# Patient Record
Sex: Male | Born: 1970 | Race: Black or African American | Hispanic: No | Marital: Single | Smoking: Current every day smoker
Health system: Southern US, Community
[De-identification: ages and names within clinical notes are randomized; demographics above are authoritative.]

---

## 2006-04-20 ENCOUNTER — Emergency Department: Payer: Self-pay | Admitting: Emergency Medicine

## 2009-10-22 ENCOUNTER — Emergency Department: Payer: Self-pay | Admitting: Emergency Medicine

## 2010-02-20 ENCOUNTER — Ambulatory Visit: Payer: Self-pay

## 2011-03-01 ENCOUNTER — Ambulatory Visit: Payer: Self-pay | Admitting: Family Medicine

## 2011-09-14 IMAGING — CR DG LUMBAR SPINE AP/LAT/OBLIQUES W/ FLEX AND EXT
1 series · 5 of 5 positions shown · non-contrast
Comparison: none

REASON FOR EXAM: lt thigh numbness and pain in rt toe
COMMENTS:

PROCEDURE:     DXR - DXR LUMBAR SPINE WITH OBLIQUES  - March 01, 2011  [DATE]
RESULT:     Comparison: None.

[Series 1: view not recorded · 0.17mm/px · 5 of 5 slices shown]
[im 1/5]
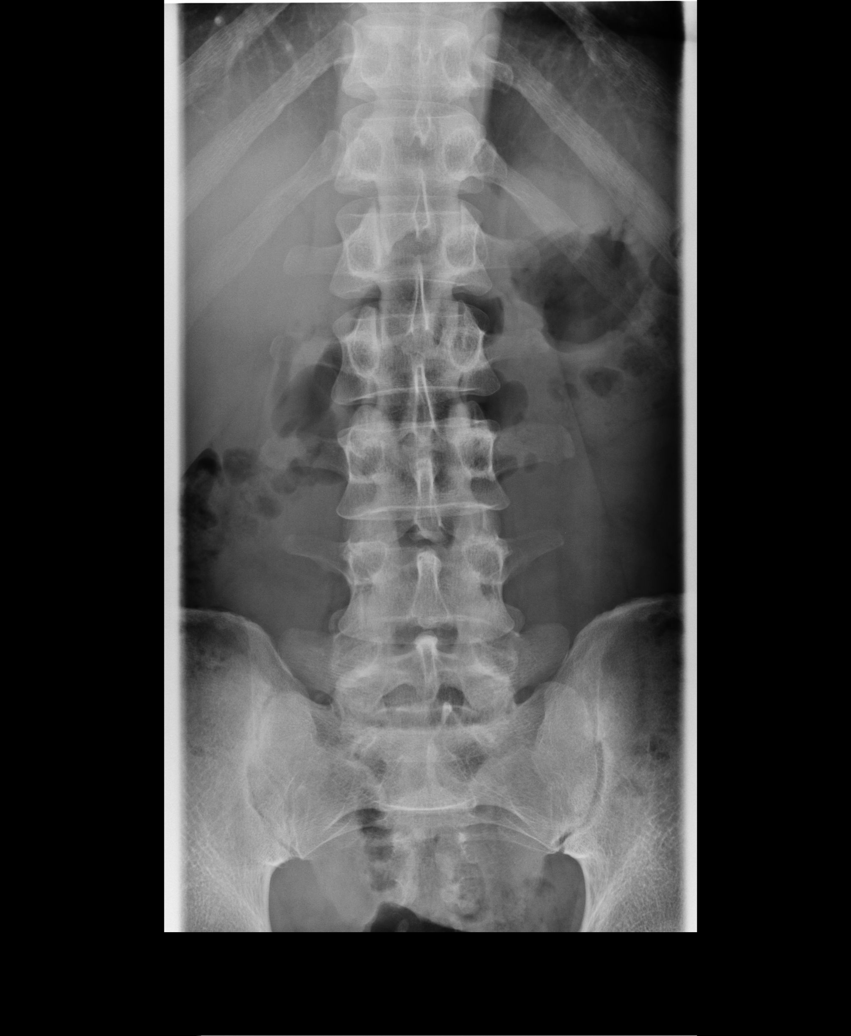
[im 2/5]
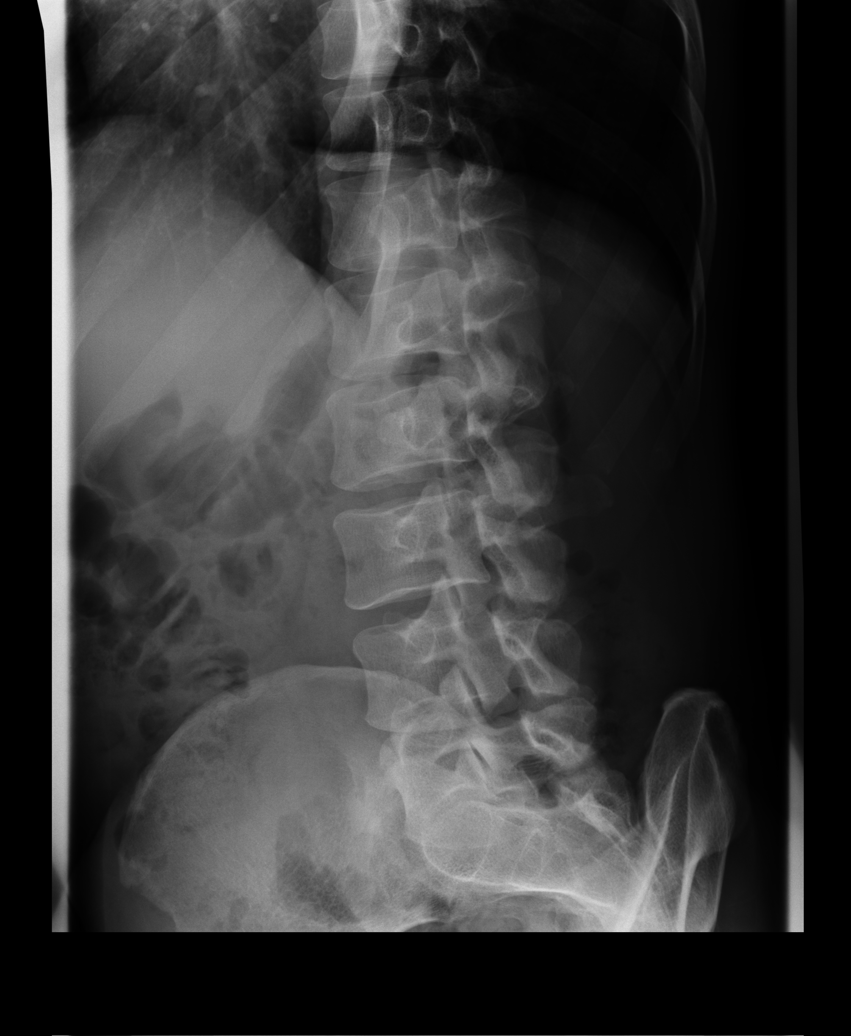
[im 3/5]
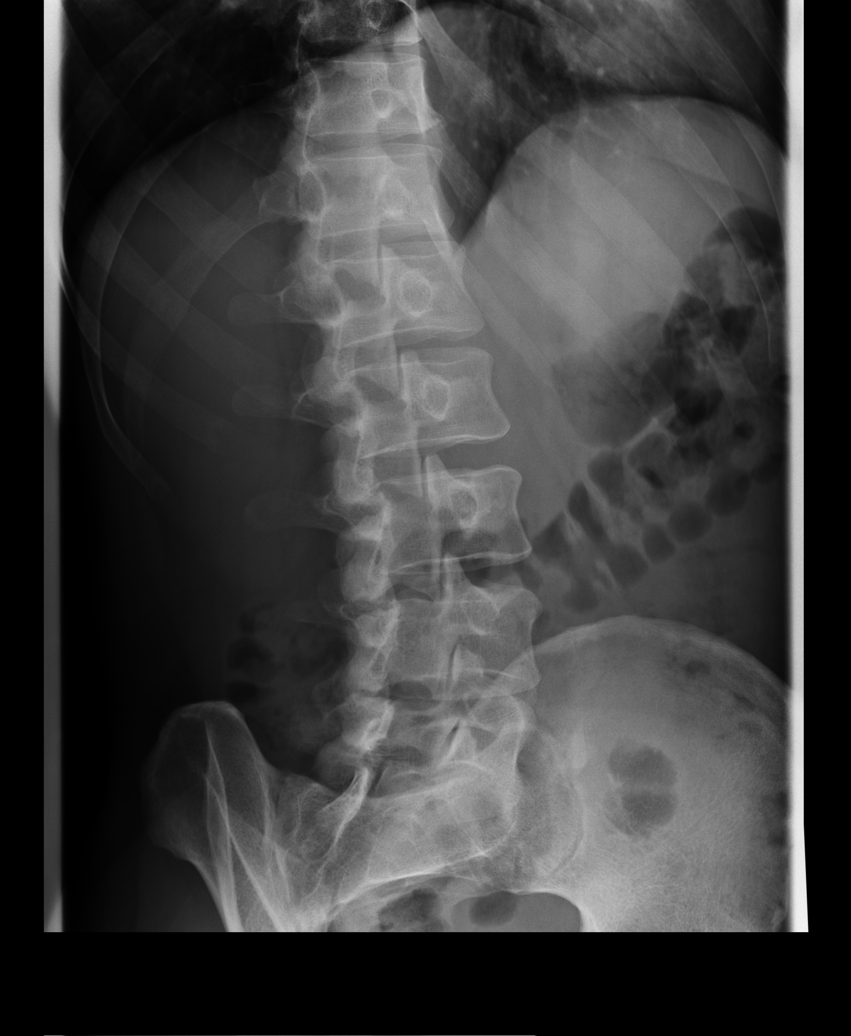
[im 4/5]
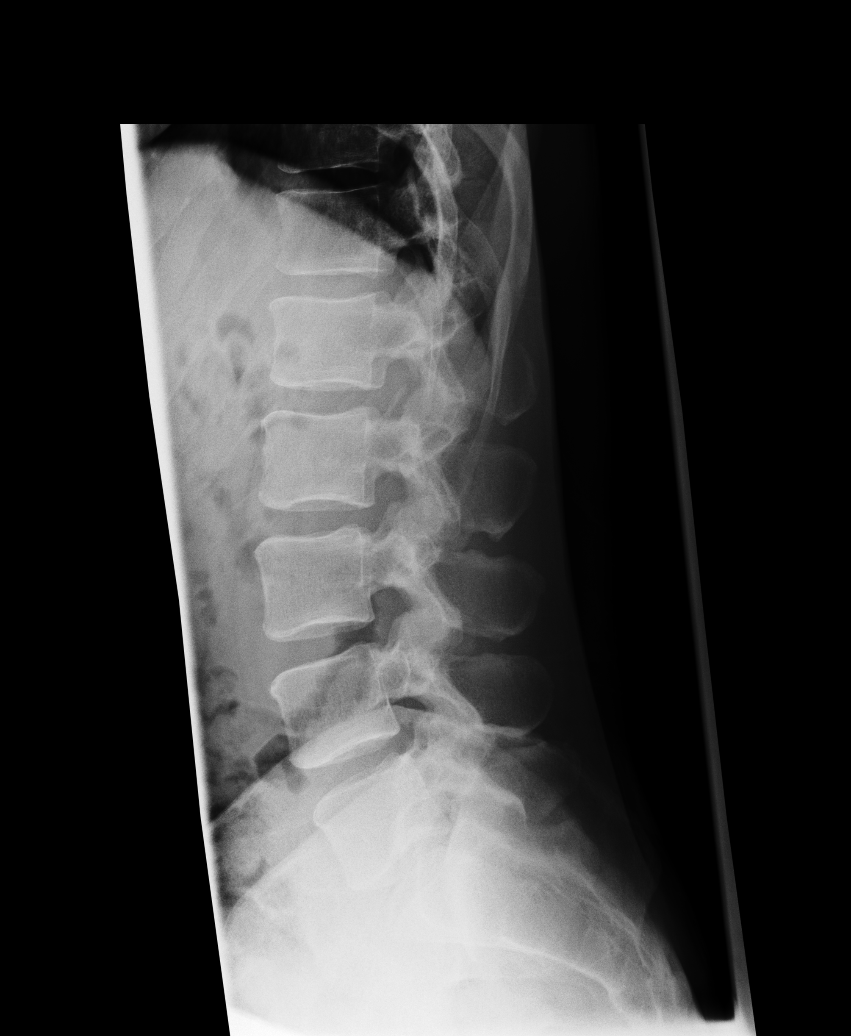
[im 5/5]
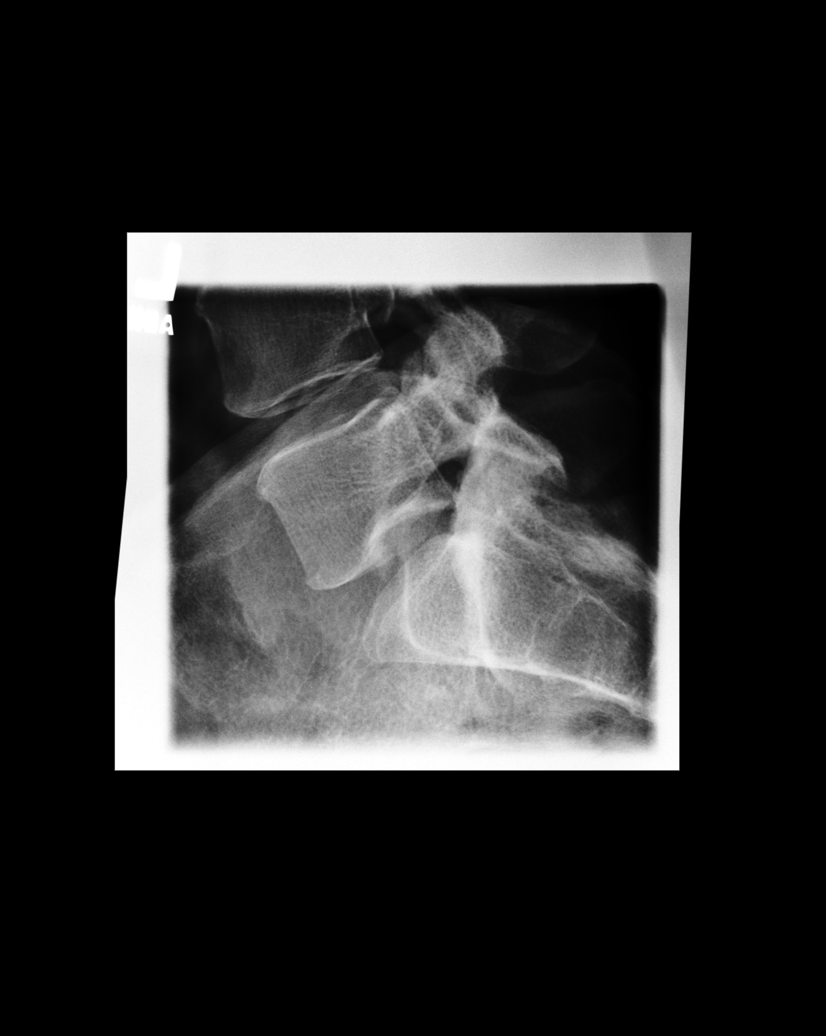

[5 of 5 positions shown; findings below may reference images not displayed]

FINDINGS: There are 5 lumbar type vertebral bodies. No pars defects identified on the
oblique views. Vertebral body heights and intervertebral disc heights are
maintained. There is normal alignment.
IMPRESSION: No significant radiographic evidence of degenerative disc disease. If there
is continued clinical concern, consider MRI for further evaluation.

## 2012-05-24 ENCOUNTER — Emergency Department: Payer: Self-pay | Admitting: Emergency Medicine

## 2013-07-11 ENCOUNTER — Emergency Department: Payer: Self-pay | Admitting: Unknown Physician Specialty

## 2015-05-07 ENCOUNTER — Encounter: Payer: Self-pay | Admitting: *Deleted

## 2015-05-07 ENCOUNTER — Emergency Department
Admission: EM | Admit: 2015-05-07 | Discharge: 2015-05-07 | Disposition: A | Discharge status: Home / Self Care | Payer: BLUE CROSS/BLUE SHIELD | Attending: Emergency Medicine | Admitting: Emergency Medicine

## 2015-05-07 DIAGNOSIS — Z720 Tobacco use: Secondary | ICD-10-CM | POA: Diagnosis not present

## 2015-05-07 DIAGNOSIS — L259 Unspecified contact dermatitis, unspecified cause: Secondary | ICD-10-CM | POA: Insufficient documentation

## 2015-05-07 DIAGNOSIS — R21 Rash and other nonspecific skin eruption: Secondary | ICD-10-CM | POA: Diagnosis present

## 2015-05-07 MED ORDER — PREDNISONE 20 MG PO TABS
ORAL_TABLET | ORAL | Status: AC
  Filled 2015-05-07: qty 1

## 2015-05-07 MED ORDER — HYDROXYZINE HCL 25 MG PO TABS
ORAL_TABLET | ORAL | Status: AC
  Filled 2015-05-07: qty 1

## 2015-05-07 MED ORDER — PREDNISONE 20 MG PO TABS: 40.0000 mg | ORAL_TABLET | Freq: Every day | ORAL | Status: AC

## 2015-05-07 MED ORDER — PREDNISONE 20 MG PO TABS
20.0000 mg | ORAL_TABLET | Freq: Once | ORAL | Status: AC
  Administered 2015-05-07: 20 mg via ORAL

## 2015-05-07 MED ORDER — PERMETHRIN 5 % EX CREA: TOPICAL_CREAM | CUTANEOUS | Status: AC

## 2015-05-07 MED ORDER — HYDROXYZINE HCL 25 MG PO TABS
25.0000 mg | ORAL_TABLET | Freq: Once | ORAL | Status: AC
  Administered 2015-05-07: 25 mg via ORAL

## 2015-05-07 MED ORDER — HYDROXYZINE HCL 25 MG PO TABS: 25.0000 mg | ORAL_TABLET | Freq: Three times a day (TID) | ORAL | Status: AC | PRN

## 2015-05-07 NOTE — Discharge Instructions (Signed)
Take medication as prescribed. Avoid triggers. Avoid scratching. Keep skin hydrated. Drink plenty of water.  Follow up with her primary care physician or the above as needed.   Return to the ER for new or worsening concerns.    Contact Dermatitis Contact dermatitis is a reaction to certain substances that touch the skin. Contact dermatitis can be either irritant contact dermatitis or allergic contact dermatitis. Irritant contact dermatitis does not require previous exposure to the substance for a reaction to occur.Allergic contact dermatitis only occurs if you have been exposed to the substance before. Upon a repeat exposure, your body reacts to the substance.  CAUSES  Many substances can cause contact dermatitis. Irritant dermatitis is most commonly caused by repeated exposure to mildly irritating substances, such as:  Makeup.  Soaps.  Detergents.  Bleaches.  Acids.  Metal salts, such as nickel. Allergic contact dermatitis is most commonly caused by exposure to:  Poisonous plants.  Chemicals (deodorants, shampoos).  Jewelry.  Latex.  Neomycin in triple antibiotic cream.  Preservatives in products, including clothing. SYMPTOMS  The area of skin that is exposed may develop:  Dryness or flaking.  Redness.  Cracks.  Itching.  Pain or a burning sensation.  Blisters. With allergic contact dermatitis, there may also be swelling in areas such as the eyelids, mouth, or genitals.  DIAGNOSIS  Your caregiver can usually tell what the problem is by doing a physical exam. In cases where the cause is uncertain and an allergic contact dermatitis is suspected, a patch skin test may be performed to help determine the cause of your dermatitis. TREATMENT Treatment includes protecting the skin from further contact with the irritating substance by avoiding that substance if possible. Barrier creams, powders, and gloves may be helpful. Your caregiver may also recommend:  Steroid  creams or ointments applied 2 times daily. For best results, soak the rash area in cool water for 20 minutes. Then apply the medicine. Cover the area with a plastic wrap. You can store the steroid cream in the refrigerator for a "chilly" effect on your rash. That may decrease itching. Oral steroid medicines may be needed in more severe cases.  Antibiotics or antibacterial ointments if a skin infection is present.  Antihistamine lotion or an antihistamine taken by mouth to ease itching.  Lubricants to keep moisture in your skin.  Burow's solution to reduce redness and soreness or to dry a weeping rash. Mix one packet or tablet of solution in 2 cups cool water. Dip a clean washcloth in the mixture, wring it out a bit, and put it on the affected area. Leave the cloth in place for 30 minutes. Do this as often as possible throughout the day.  Taking several cornstarch or baking soda baths daily if the area is too large to cover with a washcloth. Harsh chemicals, such as alkalis or acids, can cause skin damage that is like a burn. You should flush your skin for 15 to 20 minutes with cold water after such an exposure. You should also seek immediate medical care after exposure. Bandages (dressings), antibiotics, and pain medicine may be needed for severely irritated skin.  HOME CARE INSTRUCTIONS  Avoid the substance that caused your reaction.  Keep the area of skin that is affected away from hot water, soap, sunlight, chemicals, acidic substances, or anything else that would irritate your skin.  Do not scratch the rash. Scratching may cause the rash to become infected.  You may take cool baths to help stop the itching.  Only take over-the-counter or prescription medicines as directed by your caregiver.  See your caregiver for follow-up care as directed to make sure your skin is healing properly. SEEK MEDICAL CARE IF:   Your condition is not better after 3 days of treatment.  You seem to be  getting worse.  You see signs of infection such as swelling, tenderness, redness, soreness, or warmth in the affected area.  You have any problems related to your medicines. Document Released: 11/21/2000 Document Revised: 02/16/2012 Document Reviewed: 04/29/2011 [facility] Patient Information 2015 [place], [place]. This information is not intended to replace advice given to you by your health care provider. Make sure you discuss any questions you have with your health care provider.  Rash A rash is a change in the color or texture of your skin. There are many different types of rashes. You may have other problems that accompany your rash. CAUSES   Infections.  Allergic reactions. This can include allergies to pets or foods.  Certain medicines.  Exposure to certain chemicals, soaps, or cosmetics.  Heat.  Exposure to poisonous plants.  Tumors, both cancerous and noncancerous. SYMPTOMS   Redness.  Scaly skin.  Itchy skin.  Dry or cracked skin.  Bumps.  Blisters.  Pain. DIAGNOSIS  Your caregiver may do a physical exam to determine what type of rash you have. A skin sample (biopsy) may be taken and examined under a microscope. TREATMENT  Treatment depends on the type of rash you have. Your caregiver may prescribe certain medicines. For serious conditions, you may need to see a skin doctor (dermatologist). HOME CARE INSTRUCTIONS   Avoid the substance that caused your rash.  Do not scratch your rash. This can cause infection.  You may take cool baths to help stop itching.  Only take over-the-counter or prescription medicines as directed by your caregiver.  Keep all follow-up appointments as directed by your caregiver. SEEK IMMEDIATE MEDICAL CARE IF:  You have increasing pain, swelling, or redness.  You have a fever.  You have new or severe symptoms.  You have body aches, diarrhea, or vomiting.  Your rash is not better after 3 days. MAKE SURE YOU:  Understand  these instructions.  Will watch your condition.  Will get help right away if you are not doing well or get worse. Document Released: 11/14/2002 Document Revised: 02/16/2012 Document Reviewed: 09/08/2011 [facility] Patient Information 2015 [place], [place]. This information is not intended to replace advice given to you by your health care provider. Make sure you discuss any questions you have with your health care provider.

## 2015-05-07 NOTE — ED Notes (Signed)
E signature pad not working at discharge. Patient verbalized understanding of discharge instructions.

## 2015-05-07 NOTE — ED Notes (Signed)
Pt reports a generalized rash and itching that comes and goes for about 2 week.

## 2015-05-07 NOTE — ED Provider Notes (Signed)
[facility] Emergency Department Provider Note  ____________________________________________  Time seen: Approximately 10:57 PM  I have reviewed the triage vital signs and the nursing notes.   HISTORY  Chief Complaint Rash   HPI Maurice NearingRichard A Weinand Jr. is a 44 y.o. male presents to the ER for complaints of rash. Patient states that he has had a rash present for 3 weeks. Patient states the severity of rash comes and goes. Patient states that he has been using a different laundry detergent however 3 days ago he changed to his old detergent and he still has a rash. Patient states the rash initially was only on her his arms however has spread to be "everywhere". States rash is itchy.  Denies pain, burning, facial swelling or difficulty swallowing. Denies changes in foods,  lotions, medications or other contacts. Denies other at home with similar. Reports continues to eat and drink well. Denies recent sickness.   History reviewed. No pertinent past medical history.  There are no active problems to display for this patient.   History reviewed. No pertinent past surgical history.  No current outpatient prescriptions on file.  Allergies Aspirin  No family history on file.  Social History History  Substance Use Topics  . Smoking status: Current Every Day Smoker  . Smokeless tobacco: Not on file  . Alcohol Use: Yes    Review of Systems Constitutional: No fever/chills Eyes: No visual changes. ENT: No sore throat. Cardiovascular: Denies chest pain. Respiratory: Denies shortness of breath. Gastrointestinal: No abdominal pain.  No nausea, no vomiting.  No diarrhea.  No constipation. Genitourinary: Negative for dysuria. Musculoskeletal: Negative for back pain. Skin: Positive for rash. Neurological: Negative for headaches, focal weakness or numbness.  10-point ROS otherwise negative.  ____________________________________________   PHYSICAL EXAM:  VITAL  SIGNS: ED Triage Vitals  Enc Vitals Group     BP 05/07/15 2058 134/93 mmHg     Pulse Rate 05/07/15 2058 87     Resp 05/07/15 2058 16     Temp 05/07/15 2058 98.7 F (37.1 C)     Temp Source 05/07/15 2058 Oral     SpO2 05/07/15 2058 99 %     Weight 05/07/15 2058 180 lb (81.647 kg)     Height 05/07/15 2058 5\' 11"  (1.803 m)     Head Cir --      Peak Flow --      Pain Score 05/07/15 2058 5     Pain Loc --      Pain Edu? --      Excl. in GC? --     Constitutional: Alert and oriented. Well appearing and in no acute distress. Eyes: Conjunctivae are normal. PERRL. EOMI. Head: Atraumatic. Nose: No congestion/rhinnorhea. Mouth/Throat: Mucous membranes are moist.  Oropharynx non-erythematous.NO swelling. Neck: No stridor.  No cervical spine tenderness to palpation. Hematological/Lymphatic/Immunilogical: No cervical lymphadenopathy. Cardiovascular: Normal rate, regular rhythm. Grossly normal heart sounds.  Good peripheral circulation. Respiratory: Normal respiratory effort.  No retractions. Lungs CTAB. Gastrointestinal: Soft and nontender. No distention. No abdominal bruits. No CVA tenderness. Musculoskeletal: No lower extremity tenderness nor edema.  No joint effusions. Neurologic:  Normal speech and language. No gross focal neurologic deficits are appreciated. Speech is normal. No gait instability. Skin:  Skin is warm, dry and intact. Pruritic rash present to bilateral arms and hands, left ankle, back and chest. Rash pruritic small, minimally erythematous papules. No surrounding erythema, induration or fluctuance. Some scattered papules with mild excoriation. Psychiatric: Mood and affect are normal.  Speech and behavior are normal.  ___________________________________________   INITIAL IMPRESSION / ASSESSMENT AND PLAN / ED COURSE  Pertinent labs & imaging results that were available during my care of the patient were reviewed by me and considered in my medical decision making (see chart  for details).  Presents to the ER with complaints of 3 week history of a rash. Complaints of itchy rash. Reports recent change in laundry detergent and concerned that was cause of rash. Rash concerning for contact dermatitis versus scabies. Will treat with oral prednisone, hydroxyzine and topical permethrin cream. Discussed cleaning all clothes and sheets. Discussed follow-up with primary care physician. Discussed strict follow-up and return parameters. Patient agreed to plan. ____________________________________________   FINAL CLINICAL IMPRESSION(S) / ED DIAGNOSES  Final diagnoses:  Rash  Contact dermatitis      Renford Dills, NP 05/08/15 [id number]
